# Patient Record
Sex: Female | Born: 1950 | Race: Black or African American | Hispanic: Yes | Smoking: Never smoker
Health system: Southern US, Community
[De-identification: ages and names within clinical notes are randomized; demographics above are authoritative.]

## PROBLEM LIST (undated history)

## (undated) DIAGNOSIS — I10 Essential (primary) hypertension: Secondary | ICD-10-CM

## (undated) HISTORY — PX: BREAST EXCISIONAL BIOPSY: SUR124

---

## 2012-04-07 ENCOUNTER — Encounter (HOSPITAL_COMMUNITY): Payer: Self-pay | Admitting: Emergency Medicine

## 2012-04-07 ENCOUNTER — Inpatient Hospital Stay (HOSPITAL_COMMUNITY)
Admission: EM | Admit: 2012-04-07 | Discharge: 2012-04-08 | DRG: 832 | Disposition: A | Discharge status: Home / Self Care | Payer: BC Managed Care – PPO | Attending: Internal Medicine | Admitting: Internal Medicine

## 2012-04-07 ENCOUNTER — Inpatient Hospital Stay (HOSPITAL_COMMUNITY): Payer: BC Managed Care – PPO

## 2012-04-07 ENCOUNTER — Emergency Department (HOSPITAL_COMMUNITY): Payer: BC Managed Care – PPO

## 2012-04-07 DIAGNOSIS — I10 Essential (primary) hypertension: Secondary | ICD-10-CM | POA: Diagnosis present

## 2012-04-07 DIAGNOSIS — R1310 Dysphagia, unspecified: Secondary | ICD-10-CM | POA: Diagnosis present

## 2012-04-07 DIAGNOSIS — R4789 Other speech disturbances: Secondary | ICD-10-CM | POA: Diagnosis present

## 2012-04-07 DIAGNOSIS — R4702 Dysphasia: Secondary | ICD-10-CM | POA: Diagnosis present

## 2012-04-07 DIAGNOSIS — G459 Transient cerebral ischemic attack, unspecified: Principal | ICD-10-CM | POA: Diagnosis present

## 2012-04-07 DIAGNOSIS — R4701 Aphasia: Secondary | ICD-10-CM | POA: Diagnosis present

## 2012-04-07 HISTORY — DX: Essential (primary) hypertension: I10

## 2012-04-07 LAB — COMPREHENSIVE METABOLIC PANEL
ALT: 19 U/L (ref 0–35)
AST: 18 U/L (ref 0–37)
Albumin: 3.6 g/dL (ref 3.5–5.2)
Alkaline Phosphatase: 81 U/L (ref 39–117)
CO2: 27 mEq/L (ref 19–32)
Chloride: 101 mEq/L (ref 96–112)
GFR calc non Af Amer: >90 mL/min (ref >90)
Potassium: 3.5 mEq/L (ref 3.5–5.1)
Total Bilirubin: 0.3 mg/dL (ref 0.3–1.2)

## 2012-04-07 LAB — POCT I-STAT, CHEM 8
BUN: 6 mg/dL (ref 6–23)
Chloride: 104 mEq/L (ref 96–112)
Creatinine, Ser: 0.9 mg/dL (ref 0.50–1.10)
Glucose, Bld: 99 mg/dL (ref 70–99)
Potassium: 3.7 mEq/L (ref 3.5–5.1)
Sodium: 142 mEq/L (ref 135–145)

## 2012-04-07 LAB — CBC
HCT: 42.4 % (ref 36.0–46.0)
Hemoglobin: 14.3 g/dL (ref 12.0–15.0)
MCHC: 33.7 g/dL (ref 30.0–36.0)
RDW: 12.9 % (ref 11.5–15.5)
WBC: 7.2 10*3/uL (ref 4.0–10.5)

## 2012-04-07 LAB — DIFFERENTIAL
Basophils Absolute: 0 10*3/uL (ref 0.0–0.1)
Basophils Relative: 0 % (ref 0–1)
Lymphocytes Relative: 53 % — ABNORMAL HIGH (ref 12–46)
Neutro Abs: 2.4 10*3/uL (ref 1.7–7.7)
Neutrophils Relative %: 34 % — ABNORMAL LOW (ref 43–77)

## 2012-04-07 LAB — APTT: aPTT: 25 seconds (ref 24–37)

## 2012-04-07 LAB — PROTIME-INR: INR: 0.97 (ref 0.00–1.49)

## 2012-04-07 LAB — GLUCOSE, CAPILLARY

## 2012-04-07 MED ORDER — ASPIRIN 81 MG PO CHEW
324.0000 mg | CHEWABLE_TABLET | Freq: Once | ORAL | Status: AC
  Administered 2012-04-07: 324 mg via ORAL
  Filled 2012-04-07: qty 4

## 2012-04-07 MED ORDER — SODIUM CHLORIDE 0.9 % IJ SOLN
3.0000 mL | Freq: Two times a day (BID) | INTRAMUSCULAR | Status: DC
  Administered 2012-04-07: 3 mL via INTRAVENOUS

## 2012-04-07 MED ORDER — SODIUM CHLORIDE 0.9 % IV SOLN
INTRAVENOUS | Status: DC
  Administered 2012-04-07: 14:00:00 via INTRAVENOUS

## 2012-04-07 MED ORDER — ENOXAPARIN SODIUM 40 MG/0.4ML ~~LOC~~ SOLN
40.0000 mg | Freq: Every day | SUBCUTANEOUS | Status: DC
  Filled 2012-04-07: qty 0.4

## 2012-04-07 MED ORDER — ONDANSETRON HCL 4 MG/2ML IJ SOLN: 4.0000 mg | Freq: Four times a day (QID) | INTRAMUSCULAR | Status: DC | PRN

## 2012-04-07 MED ORDER — VITAMIN C 500 MG PO TABS
500.0000 mg | ORAL_TABLET | Freq: Every day | ORAL | Status: DC
  Administered 2012-04-08: 500 mg via ORAL
  Filled 2012-04-07 (×2): qty 1

## 2012-04-07 MED ORDER — ASPIRIN 300 MG RE SUPP
300.0000 mg | Freq: Every day | RECTAL | Status: DC
  Filled 2012-04-07: qty 1

## 2012-04-07 MED ORDER — TIMOLOL MALEATE 0.5 % OP SOLN
1.0000 [drp] | Freq: Every day | OPHTHALMIC | Status: DC
  Administered 2012-04-08: 1 [drp] via OPHTHALMIC
  Filled 2012-04-07: qty 5

## 2012-04-07 MED ORDER — VITAMIN E 180 MG (400 UNIT) PO CAPS
400.0000 [IU] | ORAL_CAPSULE | Freq: Every day | ORAL | Status: DC
  Administered 2012-04-08: 400 [IU] via ORAL
  Filled 2012-04-07 (×2): qty 1

## 2012-04-07 MED ORDER — ACETAMINOPHEN 325 MG PO TABS: 650.0000 mg | ORAL_TABLET | ORAL | Status: DC | PRN

## 2012-04-07 MED ORDER — ACETAMINOPHEN 650 MG RE SUPP: 650.0000 mg | RECTAL | Status: DC | PRN

## 2012-04-07 MED ORDER — ASPIRIN 325 MG PO TABS
325.0000 mg | ORAL_TABLET | Freq: Every day | ORAL | Status: DC
  Administered 2012-04-08: 325 mg via ORAL
  Filled 2012-04-07: qty 1

## 2012-04-07 MED ORDER — SENNOSIDES-DOCUSATE SODIUM 8.6-50 MG PO TABS
1.0000 | ORAL_TABLET | Freq: Every evening | ORAL | Status: DC | PRN
  Filled 2012-04-07: qty 1

## 2012-04-07 MED ORDER — SODIUM CHLORIDE 0.9 % IV SOLN: 250.0000 mL | INTRAVENOUS | Status: DC | PRN

## 2012-04-07 MED ORDER — DOXYCYCLINE HYCLATE 100 MG PO TABS: 100.0000 mg | ORAL_TABLET | Freq: Every day | ORAL | Status: AC

## 2012-04-07 NOTE — ED Notes (Signed)
Report given to Landry Corporal on floor

## 2012-04-07 NOTE — H&P (Signed)
Hospital Admission Note Date: 04/07/2012  Patient name: Catherine Bennett Medical record number: [id number] Date of birth: 1951-03-25 Age: 61 y.o. Gender: female PCP: No primary provider on file.  Attending physician: Altha Harm, MD  Chief Complaint:Slurring of speech times one day.   History of Present Illness:This is a 61 year old female with a history of hypertension who presented the emergency room with complaints of dysphagia. According to the patient's daughter she spoke with her at approximately 9 AM and noticed some slurring of speech. She also stated that by the time she saw the patient at 11 AM the patient's these were significantly slurred. The patient offers that the symptom appears to be intermittent in its nature. The patient denies any weakness but does state that this morning she was feeling "sluggish". She however is clear to articulate that she did not have any focal weakness, headache, blurred vision, difficulty swallowing, drooling, chest pain.  On my observations the patient her speech is a very deliberate likened to an intentional over articulation mimicking slurring. This pattern occurs intermittently throughout the time of my evaluation.   Scheduled Meds:   . aspirin  324 mg Oral Once  . sodium chloride  3 mL Intravenous Q12H   Continuous Infusions:   . sodium chloride 100 mL/hr at 04/07/12 1421  . sodium chloride     PRN Meds:.sodium chloride Allergies: Penicillins Past Medical History  Diagnosis Date  . Hypertension    History reviewed. No pertinent past surgical history. Family History  Problem Relation Age of Onset  . Hypertension Mother    History   Social History  . Marital Status: Legally Separated    Spouse Name: N/A    Number of Children: N/A  . Years of Education: N/A   Occupational History  . Not on file.   Social History Main Topics  . Smoking status: Never Smoker   . Smokeless tobacco: Never Used  . Alcohol Use: No  . Drug  Use: No  . Sexually Active:    Other Topics Concern  . Not on file   Social History Narrative  . No narrative on file   Review of Systems: A comprehensive review of systems was negative. Physical Exam:  Intake/Output Summary (Last 24 hours) at 04/07/12 1739 Last data filed at 04/07/12 1503  Gross per 24 hour  Intake      0 ml  Output    525 ml  Net   -525 ml   General: Alert, awake, oriented x3, in no acute distress.  HEENT: [place]/AT PEERL, EOMI Neck: Trachea midline,  no masses, no thyromegal,y no JVD, no carotid bruit OROPHARYNX:  Moist, No exudate/ erythema/lesions.  Heart: Regular rate and rhythm, without murmurs, rubs, gallops.  Lungs: Clear to auscultation, no wheezing or rhonchi noted.  Abdomen: Soft, nontender, nondistended, positive bowel sounds, no masses no hepatosplenomegaly noted.  Neuro: No focal neurological deficits noted cranial nerves II through XII grossly intact. DTRs 2+ bilaterally upper and lower extremities. Strength 5/5 in bilateral upper and lower extremities. Pass pointing and heel-shin test normal. Tandem walking normal. Musculoskeletal: No warm swelling or erythema around joints, no spinal tenderness noted.   Lab results:  [facility] 04/07/12 1427 04/07/12 1415  NA 142 138  K 3.7 3.5  CL 104 101  CO2 -- 27  GLUCOSE 99 94  BUN 6 7  CREATININE 0.90 0.62  CALCIUM -- 10.4  MG -- --  PHOS -- --    Basename 04/07/12 1415  AST 18  ALT  19  ALKPHOS 81  BILITOT 0.3  PROT 8.4*  ALBUMIN 3.6   No results found for this basename: LIPASE:2,AMYLASE:2 in the last 72 hours  Basename 04/07/12 1427 04/07/12 1415  WBC -- 7.2  NEUTROABS -- 2.4  HGB 15.3* 14.3  HCT 45.0 42.4  MCV -- 89.6  PLT -- 266    Basename 04/07/12 1415  CKTOTAL --  CKMB --  CKMBINDEX --  TROPONINI <0.30   No components found with this basename: POCBNP:3 No results found for this basename: DDIMER:2 in the last 72 hours No results found for this basename: HGBA1C:2 in the  last 72 hours No results found for this basename: CHOL:2,HDL:2,LDLCALC:2,TRIG:2,CHOLHDL:2,LDLDIRECT:2 in the last 72 hours No results found for this basename: TSH,T4TOTAL,FREET3,T3FREE,THYROIDAB in the last 72 hours No results found for this basename: VITAMINB12:2,FOLATE:2,FERRITIN:2,TIBC:2,IRON:2,RETICCTPCT:2 in the last 72 hours Imaging results:  Ct Head Wo Contrast  04/07/2012  *RADIOLOGY REPORT*  Clinical Data: Aphasia.  Code stroke.  CT HEAD WITHOUT CONTRAST  Technique:  Contiguous axial images were obtained from the base of the skull through the vertex without contrast.  Comparison: Head CT 05/30/2004.  Findings: Mild cerebral and cerebellar atrophy.  No acute intracranial abnormality.  Specifically, no definite evidence of large vascular territory acute/subacute cerebral ischemia, no evidence of acute intracerebral hemorrhage, no focal mass, mass effect, hydrocephalus or abnormal intra or extra-axial fluid collections.  Visualized paranasal sinuses and mastoids are well pneumatized.  No acute displaced skull fractures are identified.  IMPRESSION: 1.  No acute intracranial abnormalities. 2.  Mild cerebral and cerebellar atrophy.  These results were called by telephone on 04/07/2012 at 02:30 p.m. to Dr. Ethelda Chick, who verbally acknowledged these results.   Original Report Authenticated By: Trudie Reed, M.D.    Other results: EKG:  normal sinus rhythm.   Patient Active Hospital Problem List: No active hospital problems.  Dysphasia:  Patient presents with dysphagia. It is unclear as to whether or not this is idiosyncratic oral may represent a true feature of an evolving stroke. The CT scan so far is negative. However the patient will be admitted and observed while receiving MRI for further evaluation and risk factor evaluation for better risk stratification.  Hypertension: The patient does have a history of hypertension, however in keeping with stroke recommendations will hold  antihypertensive medications for the next 24 hours and re-evaluate.   MATTHEWS,MICHELLE A. 04/07/2012, 5:39 PM

## 2012-04-07 NOTE — ED Notes (Signed)
hospitalist at bedside

## 2012-04-07 NOTE — ED Notes (Signed)
Pt's daughter states that she is normally a very articulate woman and this morning while she was on the phone, pt started having difficulty speaking.  Pt is stuttering and drawing out her words and speaking slowly.  Pt states that nothing is wrong and that she doesn't thing she has difficulty speaking.  All other neuro tests were WNL.  Last known well at 0900 this morning.  Told her daughter at that time that she wasn't feeling very well.

## 2012-04-07 NOTE — ED Notes (Signed)
rn talked with md, neurologist coming to [facility] ED, pt will not be transported.  Pt alert and oriented x4. Respirations even and unlabored, bilateral symmetrical rise and fall of chest. Skin warm and dry. In no acute distress. Denies needs.

## 2012-04-07 NOTE — ED Provider Notes (Signed)
History     CSN: [id number]  Arrival date & time 04/07/12  1332   First MD Initiated Contact with Patient 04/07/12 1402      Chief Complaint  Patient presents with  . Aphasia    (Consider location/radiation/quality/duration/timing/severity/associated sxs/prior treatment) HPI Patient developed difficulty speaking onset today. Last normal 9 9 AM today. Her daughter spoke with her at 9:53 AM today when she noticed that she had difficulty speaking. Patient denies any other complaint. No treatment prior to coming here her daughter reports that her speech progressively worsened until approximately 11 AM today. She remains with difficulty speaking. Patient denies focal weakness denies other complaint. No treatment prior to coming here Past Medical History  Diagnosis Date  . Hypertension     No past surgical history on file.  No family history on file.  History  Substance Use Topics  . Smoking status: Never Smoker   . Smokeless tobacco: Never Used  . Alcohol Use: No    OB History    Grav Para Term Preterm Abortions TAB SAB Ect Mult Living                  Review of Systems  Constitutional: Negative.   HENT: Negative.   Respiratory: Negative.   Cardiovascular: Negative.   Gastrointestinal: Negative.   Musculoskeletal: Negative.   Skin: Negative.   Neurological: Positive for speech difficulty.  Hematological: Negative.   Psychiatric/Behavioral: Negative.   All other systems reviewed and are negative.    Allergies  Penicillins  Home Medications  No current outpatient prescriptions on file.  BP 150/90  Pulse 83  Temp 98.6 F (37 C) (Oral)  Resp 16  Wt 210 lb (95.255 kg)  SpO2 100%  Physical Exam  Nursing note and vitals reviewed. Constitutional: She is oriented to person, place, and time. She appears well-developed and well-nourished.  HENT:  Head: Normocephalic and atraumatic.  Eyes: Conjunctivae normal are normal. Pupils are equal, round, and reactive to  light.  Neck: Neck supple. No tracheal deviation present. No thyromegaly present.  Cardiovascular: Normal rate and regular rhythm.   No murmur heard. Pulmonary/Chest: Effort normal and breath sounds normal.  Abdominal: Soft. Bowel sounds are normal. She exhibits no distension. There is no tenderness.  Musculoskeletal: Normal range of motion. She exhibits no edema and no tenderness.  Neurological: She is alert and oriented to person, place, and time. She has normal reflexes. No cranial nerve deficit. Coordination normal.       Expressive dysphasia motor strength 5 over 5 overall  Skin: Skin is warm and dry. No rash noted.  Psychiatric: She has a normal mood and affect.    ED Course  Procedures (including critical care time) Code stroke called by me.  Date: 04/07/2012  Rate: 80  Rhythm: normal sinus rhythm  QRS Axis: normal  Intervals: normal  ST/T Wave abnormalities: normal  Conduction Disutrbances: none  Narrative Interpretation: unremarkable  No old tracing for comparison Patient administered aspirin, mild after she passed swallowing screen Code stroke canceled the conversation with neurologist  Labs Reviewed - No data to display No results found. 4:20 PM patient speech is normal and she is asymptomatic Patient evaluated by neurologist Suggests admission for MRI MRA  No diagnosis found. Results for orders placed during the hospital encounter of 04/07/12  PROTIME-INR      Component Value Range   Prothrombin Time 12.8  11.6 - 15.2 seconds   INR 0.97  0.00 - 1.49  APTT  Component Value Range   aPTT 25  24 - 37 seconds  CBC      Component Value Range   WBC 7.2  4.0 - 10.5 K/uL   RBC 4.73  3.87 - 5.11 MIL/uL   Hemoglobin 14.3  12.0 - 15.0 g/dL   HCT 16.1  09.6 - 04.5 %   MCV 89.6  78.0 - 100.0 fL   MCH 30.2  26.0 - 34.0 pg   MCHC 33.7  30.0 - 36.0 g/dL   RDW 40.9  81.1 - 91.4 %   Platelets 266  150 - 400 K/uL  DIFFERENTIAL      Component Value Range    Neutrophils Relative 34 (*) 43 - 77 %   Neutro Abs 2.4  1.7 - 7.7 K/uL   Lymphocytes Relative 53 (*) 12 - 46 %   Lymphs Abs 3.8  0.7 - 4.0 K/uL   Monocytes Relative 11  3 - 12 %   Monocytes Absolute 0.8  0.1 - 1.0 K/uL   Eosinophils Relative 2  0 - 5 %   Eosinophils Absolute 0.1  0.0 - 0.7 K/uL   Basophils Relative 0  0 - 1 %   Basophils Absolute 0.0  0.0 - 0.1 K/uL  COMPREHENSIVE METABOLIC PANEL      Component Value Range   Sodium 138  135 - 145 mEq/L   Potassium 3.5  3.5 - 5.1 mEq/L   Chloride 101  96 - 112 mEq/L   CO2 27  19 - 32 mEq/L   Glucose, Bld 94  70 - 99 mg/dL   BUN 7  6 - 23 mg/dL   Creatinine, Ser 7.82  0.50 - 1.10 mg/dL   Calcium 95.6  8.4 - 21.3 mg/dL   Total Protein 8.4 (*) 6.0 - 8.3 g/dL   Albumin 3.6  3.5 - 5.2 g/dL   AST 18  0 - 37 U/L   ALT 19  0 - 35 U/L   Alkaline Phosphatase 81  39 - 117 U/L   Total Bilirubin 0.3  0.3 - 1.2 mg/dL   GFR calc non Af Amer >90  >90 mL/min   GFR calc Af Amer >90  >90 mL/min  TROPONIN I      Component Value Range   Troponin I <0.30  <0.30 ng/mL  GLUCOSE, CAPILLARY      Component Value Range   Glucose-Capillary 96  70 - 99 mg/dL  POCT I-STAT, CHEM 8      Component Value Range   Sodium 142  135 - 145 mEq/L   Potassium 3.7  3.5 - 5.1 mEq/L   Chloride 104  96 - 112 mEq/L   BUN 6  6 - 23 mg/dL   Creatinine, Ser 0.86  0.50 - 1.10 mg/dL   Glucose, Bld 99  70 - 99 mg/dL   Calcium, Ion 5.78 (*) 1.13 - 1.30 mmol/L   TCO2 27  0 - 100 mmol/L   Hemoglobin 15.3 (*) 12.0 - 15.0 g/dL   HCT 46.9  62.9 - 52.8 %   Ct Head Wo Contrast  04/07/2012  *RADIOLOGY REPORT*  Clinical Data: Aphasia.  Code stroke.  CT HEAD WITHOUT CONTRAST  Technique:  Contiguous axial images were obtained from the base of the skull through the vertex without contrast.  Comparison: Head CT 05/30/2004.  Findings: Mild cerebral and cerebellar atrophy.  No acute intracranial abnormality.  Specifically, no definite evidence of large vascular territory acute/subacute  cerebral ischemia, no evidence of acute intracerebral  hemorrhage, no focal mass, mass effect, hydrocephalus or abnormal intra or extra-axial fluid collections.  Visualized paranasal sinuses and mastoids are well pneumatized.  No acute displaced skull fractures are identified.  IMPRESSION: 1.  No acute intracranial abnormalities. 2.  Mild cerebral and cerebellar atrophy.  These results were called by telephone on 04/07/2012 at 02:30 p.m. to Dr. Ethelda Chick, who verbally acknowledged these results.   Original Report Authenticated By: Trudie Reed, M.D.       MDM  Spoke with Dr. Ashley Royalty, hospitalist plan admission, telemetry, inpatient evaluation for stroke Dx expressive aphasia        Doug Sou, MD 04/07/12 1624

## 2012-04-07 NOTE — ED Notes (Signed)
Pt wants to refuse chest xray to "keep hospital cost down". Note sent to hospitalist. Pt will be transported upstairs.

## 2012-04-07 NOTE — Consult Note (Signed)
Reason for Consult: Slurred speech Referring Physician: Doug Sou  CC: Slurred speech  History is obtained from: Patient, daughter  HPI: Catherine Bennett is a 61 y.o. female who was in her normal state of health up until 9 AM. Sometime after that, the patient noticed that she was slurring her speech, and felt like she had heaviness throughout her right side including her face. She initially said that she might have had numbness too, but later says that she did not mean this at all.  Throughout the interview she has intermittent slowed speech. She states that this has been coming and going all day.   ROS: A 14 point ROS was performed and is negative except as noted in the HPI.  Past Medical History  Diagnosis Date  . Hypertension     Family History: Mother- heart disease  Social History: Tob: Denies  Exam: Current vital signs: BP 157/95  Pulse 72  Temp 98.6 F (37 C) (Oral)  Resp 18  Ht 5\' 4"  (1.626 m)  Wt 95.255 kg (210 lb)  BMI 36.05 kg/m2  SpO2 100% Vital signs in last 24 hours: Temp:  [98.6 F (37 C)-99 F (37.2 C)] 98.6 F (37 C) (11/02 1836) Pulse Rate:  [72-83] 72  (11/02 1836) Resp:  [12-22] 18  (11/02 1836) BP: (145-162)/(80-97) 157/95 mmHg (11/02 1836) SpO2:  [98 %-100 %] 100 % (11/02 1836) Weight:  [95.255 kg (210 lb)] 95.255 kg (210 lb) (11/02 1343)  General: In bed, does not seem particularly concerned about her situation CV: Regular rate and rhythm Mental Status: Patient is awake, alert, oriented to person, place, month, year, and situation. Immediate and remote memory are intact. Patient is able to give a clear and coherent history. Able to spell world backwards without difficulty Ablet o give # of quarters in $2.75  Patient does not have any clear dysarthria, she does have a delivers speech pattern that appears psychogenic in nature. She is slow to respond, until she saw her daughter doing something with her phone she did not want to and then  quickly spoke without deficit. When she turned back to me, her speech was again slowed.  Cranial Nerves: II: Visual Fields are full. Pupils are equal, round, and reactive to light.  Discs are difficult to visualize. III,IV, VI: EOMI without ptosis or diploplia.  V: Facial sensation is symmetric to temperature VII: Facial movement is symmetric.  VIII: hearing is intact to voice X: Uvula elevates symmetrically XI: Shoulder shrug is symmetric. XII: tongue is midline without atrophy or fasciculations.  Motor: Tone is normal. Bulk is normal. 5/5 strength was present in all four extremities.  Sensory: Sensation is symmetric to light touch and temperature in the arms and legs. Deep Tendon Reflexes: 2+ and symmetric in the biceps and patellae.  Plantars: Toes are downgoing bilaterally.  Cerebellar: FNF and HKS are intact bilaterally Gait: Patient has a stable casual gait. 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 I am concerned that she might have had a TIA earlier today given her statement about her right-sided "heaviness" that subsequently resolved. It is possible that she had a mild aphasia of onset, and since has been embellishing as a conversion reaction. Given the right-sided weakness earlier, I would recommend treating this as a TIA and admitting her for a TIA workup  Recommendations: 1. HgbA1c, fasting lipid panel 2. MRI, MRA  of the brain without contrast 3. Echocardiogram 4. Carotid dopplers 5. Prophylactic therapy-Antiplatelet med: Aspirin - dose 325mg .  6. Risk factor modification 7. Telemetry monitoring   Ritta Slot, MD [facility] [phone]  If 7pm- 7am, please page neurology on call at [phone].

## 2012-04-07 NOTE — ED Notes (Signed)
Patient transported to CT 

## 2012-04-07 NOTE — ED Notes (Signed)
First attempt to call report. Floor rn will call back in a few minutes 

## 2012-04-08 ENCOUNTER — Inpatient Hospital Stay (HOSPITAL_COMMUNITY): Payer: BC Managed Care – PPO

## 2012-04-08 DIAGNOSIS — G459 Transient cerebral ischemic attack, unspecified: Secondary | ICD-10-CM

## 2012-04-08 DIAGNOSIS — R4789 Other speech disturbances: Secondary | ICD-10-CM

## 2012-04-08 LAB — LIPID PANEL
Cholesterol: 173 mg/dL (ref 0–200)
LDL Cholesterol: 74 mg/dL (ref 0–99)
Total CHOL/HDL Ratio: 2.1 RATIO
VLDL: 17 mg/dL (ref 0–40)

## 2012-04-08 LAB — GLUCOSE, CAPILLARY: Glucose-Capillary: 108 mg/dL — ABNORMAL HIGH (ref 70–99)

## 2012-04-08 MED ORDER — LORAZEPAM 2 MG/ML IJ SOLN
1.0000 mg | Freq: Once | INTRAMUSCULAR | Status: AC
  Administered 2012-04-08: 1 mg via INTRAVENOUS
  Filled 2012-04-08: qty 1

## 2012-04-08 MED ORDER — ASPIRIN EC 81 MG PO TBEC: 81.0000 mg | DELAYED_RELEASE_TABLET | Freq: Every day | ORAL | Status: AC

## 2012-04-08 NOTE — Progress Notes (Signed)
VASCULAR LAB PRELIMINARY  PRELIMINARY  PRELIMINARY  PRELIMINARY  Carotid Dopplers completed.    Preliminary report:  No significant ICA stenosis.  Vertebral artery flow is antegrade.  Sahir Tolson, 04/08/2012, 10:13 AM

## 2012-04-08 NOTE — Progress Notes (Signed)
  Echocardiogram 2D Echocardiogram has been performed.  Catherine Bennett 04/08/2012, 10:04 AM

## 2012-04-08 NOTE — Discharge Summary (Signed)
Physician Discharge Summary  Llana Deshazo NWG:[id number] DOB: 01-21-1951 DOA: 04/07/2012  PCP: No primary provider on file.  Admit date: 04/07/2012 Discharge date: 04/08/2012  Recommendations for Outpatient Follow-up:  1. Pt will need to follow up with PCP in 2-3 weeks post discharge 2. Please obtain BMP to evaluate electrolytes and kidney function 3. Please also check CBC to evaluate Hg and Hct levels 4. Pt was advised to call back in AM to go over the results of 2 D ECHO which are pending at the time of the discharge  Discharge Diagnoses: Slurred speech, ? TIA, resolved Active Problems:  HTN (hypertension)  Dysphasia  TIA (transient ischemic attack)  Discharge Condition: Stable  Diet recommendation: Heart healthy diet discussed in details   History of present illness:  Pt is 61 year old female with a history of hypertension who presented the emergency room with complaints of aphasia. According to the patient's daughter she spoke with her at approximately 9 AM and noticed some slurring of speech. She also stated that by the time she saw the patient at 11 AM the patient's speech was significantly slurred. The patient offers that the symptom appears to be intermittent in its nature. The patient denies any weakness but does state that the morning of admission she was feeling "sluggish". She however is clear to articulate that she did not have any focal weakness, headache, blurred vision, difficulty swallowing, drooling, chest pain.   Hospital Course:  Active Problems:  HTN (hypertension), accelerated - pt reports compliance with medical therapy - encourage dietary compliance as well - regular BP checks with PCP   TIA (transient ischemic attack) - slurred speech resolved - MRI indicative of remote infarct but nothing acute - discussed with neurologist and he was agreeable with pt's discharge as per pt request - preliminary report on carotid doppler is negative - 2  D ECHO pending at  the time of the discharge - pt agreed to call in AM for results - will prescribe Aspirin upon discharge   Procedures/Studies: Ct Head Wo Contrast 04/07/2012     1.  No acute intracranial abnormalities.  2.  Mild cerebral and cerebellar atrophy.   Mr Maxine Glenn Head/brain Wo Cm 04/08/2012    Remote left mid pontine lacunar infarct.   Mild cerebral and cerebellar atrophy.  No acute intracranial findings.   MRA HEAD    No proximal intracranial stenosis or occlusion.     Consultations:  Neurology   Antibiotics:  None  Discharge Exam: Filed Vitals:   04/08/12 0533  BP: 131/89  Pulse: 80  Temp: 98.4 F (36.9 C)  Resp: 18   Filed Vitals:   04/07/12 1836 04/07/12 1900 04/07/12 2301 04/08/12 0533  BP: 157/95  134/75 131/89  Pulse: 72  88 80  Temp: 98.6 F (37 C)  98.2 F (36.8 C) 98.4 F (36.9 C)  TempSrc:   Oral Oral  Resp: 18  16 18   Height:  5\' 4"  (1.626 m)    Weight:      SpO2: 100%  100% 98%    General: Pt is alert, follows commands appropriately, not in acute distress Cardiovascular: Regular rate and rhythm, S1/S2 +, no murmurs, no rubs, no gallops Respiratory: Clear to auscultation bilaterally, no wheezing, no crackles, no rhonchi Abdominal: Soft, non tender, non distended, bowel sounds +, no guarding Extremities: no edema, no cyanosis, pulses palpable bilaterally DP and PT Neuro: Grossly nonfocal  Discharge Instructions  Discharge Orders    Future Orders Please Complete By Expires  Diet - low sodium heart healthy      Diet - low sodium heart healthy      Increase activity slowly      Increase activity slowly          Medication List     As of 04/08/2012 12:06 PM    TAKE these medications         aspirin EC 81 MG tablet   Take 1 tablet (81 mg total) by mouth daily.      timolol 0.5 % ophthalmic solution   Commonly known as: BETIMOL   Place 1 drop into both eyes every morning.      triamterene-hydrochlorothiazide 37.5-25 MG per tablet   Commonly  known as: MAXZIDE-25   Take 1 tablet by mouth every morning.      vitamin C 500 MG tablet   Commonly known as: ASCORBIC ACID   Take 500 mg by mouth daily.      vitamin E 400 UNIT capsule   Generic drug: vitamin E   Take 400 Units by mouth daily.           Follow-up Information    Follow up with Debbora Presto, MD. (primary care provider as needed)    Contact information:   [address] [place] [address] cell [phone]          The results of significant diagnostics from this hospitalization (including imaging, microbiology, ancillary and laboratory) are listed below for reference.     Microbiology: No results found for this or any previous visit (from the past 240 hour(s)).   Labs: Basic Metabolic Panel:  Lab 04/07/12 9147 04/07/12 1415  NA 142 138  K 3.7 3.5  CL 104 101  CO2 -- 27  GLUCOSE 99 94  BUN 6 7  CREATININE 0.90 0.62  CALCIUM -- 10.4  MG -- --  PHOS -- --   Liver Function Tests:  Lab 04/07/12 1415  AST 18  ALT 19  ALKPHOS 81  BILITOT 0.3  PROT 8.4*  ALBUMIN 3.6   CBC:  Lab 04/07/12 1427 04/07/12 1415  WBC -- 7.2  NEUTROABS -- 2.4  HGB 15.3* 14.3  HCT 45.0 42.4  MCV -- 89.6  PLT -- 266   Cardiac Enzymes:  Lab 04/07/12 1415  CKTOTAL --  CKMB --  CKMBINDEX --  TROPONINI <0.30   CBG:  Lab 04/08/12 0753 04/07/12 2048 04/07/12 1442  GLUCAP 108* 117* 96    SIGNED: Time coordinating discharge: Over 30 minutes  Debbora Presto, MD  [facility] 04/08/2012, 12:06 PM Pager [phone]  If 7PM-7AM, please contact night-coverage [email or web address] Password [email or web address]

## 2012-04-09 LAB — POCT I-STAT TROPONIN I: Troponin i, poc: 0 ng/mL (ref 0.00–0.08)

## 2013-03-04 ENCOUNTER — Other Ambulatory Visit: Payer: Self-pay | Admitting: Family Medicine

## 2013-03-04 DIAGNOSIS — R921 Mammographic calcification found on diagnostic imaging of breast: Secondary | ICD-10-CM

## 2013-03-14 ENCOUNTER — Ambulatory Visit
Admission: RE | Admit: 2013-03-14 | Discharge: 2013-03-14 | Disposition: A | Discharge status: Home / Self Care | Payer: BC Managed Care – PPO | Source: Ambulatory Visit | Attending: Family Medicine | Admitting: Family Medicine

## 2013-03-14 DIAGNOSIS — R921 Mammographic calcification found on diagnostic imaging of breast: Secondary | ICD-10-CM

## 2014-03-27 ENCOUNTER — Other Ambulatory Visit: Payer: Self-pay | Admitting: Family Medicine

## 2014-03-27 DIAGNOSIS — R921 Mammographic calcification found on diagnostic imaging of breast: Secondary | ICD-10-CM

## 2014-04-22 ENCOUNTER — Ambulatory Visit
Admission: RE | Admit: 2014-04-22 | Discharge: 2014-04-22 | Disposition: A | Discharge status: Home / Self Care | Payer: BC Managed Care – PPO | Source: Ambulatory Visit | Attending: Family Medicine | Admitting: Family Medicine

## 2014-04-22 DIAGNOSIS — R921 Mammographic calcification found on diagnostic imaging of breast: Secondary | ICD-10-CM

## 2016-10-05 ENCOUNTER — Other Ambulatory Visit: Payer: Self-pay | Admitting: Family Medicine

## 2016-10-05 DIAGNOSIS — R921 Mammographic calcification found on diagnostic imaging of breast: Secondary | ICD-10-CM

## 2016-10-27 ENCOUNTER — Ambulatory Visit
Admission: RE | Admit: 2016-10-27 | Discharge: 2016-10-27 | Disposition: A | Discharge status: Home / Self Care | Payer: BC Managed Care – PPO | Source: Ambulatory Visit | Attending: Family Medicine | Admitting: Family Medicine

## 2016-10-27 DIAGNOSIS — R921 Mammographic calcification found on diagnostic imaging of breast: Secondary | ICD-10-CM

## 2020-03-25 ENCOUNTER — Other Ambulatory Visit: Payer: Self-pay | Admitting: Family Medicine

## 2020-03-25 DIAGNOSIS — Z1231 Encounter for screening mammogram for malignant neoplasm of breast: Secondary | ICD-10-CM

## 2020-04-28 ENCOUNTER — Ambulatory Visit: Payer: BC Managed Care – PPO

## 2021-07-12 ENCOUNTER — Ambulatory Visit
Admission: RE | Admit: 2021-07-12 | Discharge: 2021-07-12 | Disposition: A | Discharge status: Home / Self Care | Payer: BC Managed Care – PPO | Source: Ambulatory Visit | Attending: Family Medicine | Admitting: Family Medicine

## 2021-07-12 ENCOUNTER — Ambulatory Visit
Admission: RE | Admit: 2021-07-12 | Discharge: 2021-07-12 | Disposition: A | Discharge status: Home / Self Care | Payer: BC Managed Care – PPO | Attending: Family Medicine | Admitting: Family Medicine

## 2021-07-12 ENCOUNTER — Other Ambulatory Visit: Payer: Self-pay | Admitting: Family Medicine

## 2021-07-12 DIAGNOSIS — M549 Dorsalgia, unspecified: Secondary | ICD-10-CM

## 2021-07-12 DIAGNOSIS — M542 Cervicalgia: Secondary | ICD-10-CM | POA: Insufficient documentation

## 2021-07-13 ENCOUNTER — Other Ambulatory Visit (HOSPITAL_COMMUNITY): Payer: Self-pay | Admitting: Family Medicine

## 2021-07-13 ENCOUNTER — Other Ambulatory Visit: Payer: Self-pay | Admitting: Family Medicine

## 2021-07-13 DIAGNOSIS — M542 Cervicalgia: Secondary | ICD-10-CM

## 2021-07-15 ENCOUNTER — Ambulatory Visit: Payer: BC Managed Care – PPO

## 2023-01-09 ENCOUNTER — Other Ambulatory Visit: Payer: Self-pay

## 2023-01-09 ENCOUNTER — Emergency Department (HOSPITAL_COMMUNITY): Payer: BC Managed Care – PPO

## 2023-01-09 ENCOUNTER — Encounter (HOSPITAL_COMMUNITY): Payer: Self-pay

## 2023-01-09 ENCOUNTER — Emergency Department (HOSPITAL_COMMUNITY)
Admission: EM | Admit: 2023-01-09 | Discharge: 2023-01-09 | Disposition: A | Discharge status: Home / Self Care | Payer: BC Managed Care – PPO | Attending: Emergency Medicine | Admitting: Emergency Medicine

## 2023-01-09 DIAGNOSIS — E876 Hypokalemia: Secondary | ICD-10-CM | POA: Insufficient documentation

## 2023-01-09 DIAGNOSIS — Z79899 Other long term (current) drug therapy: Secondary | ICD-10-CM | POA: Insufficient documentation

## 2023-01-09 DIAGNOSIS — I10 Essential (primary) hypertension: Secondary | ICD-10-CM | POA: Insufficient documentation

## 2023-01-09 DIAGNOSIS — L0291 Cutaneous abscess, unspecified: Secondary | ICD-10-CM

## 2023-01-09 DIAGNOSIS — H00031 Abscess of right upper eyelid: Secondary | ICD-10-CM | POA: Insufficient documentation

## 2023-01-09 DIAGNOSIS — Z8673 Personal history of transient ischemic attack (TIA), and cerebral infarction without residual deficits: Secondary | ICD-10-CM | POA: Diagnosis not present

## 2023-01-09 DIAGNOSIS — Z7982 Long term (current) use of aspirin: Secondary | ICD-10-CM | POA: Insufficient documentation

## 2023-01-09 LAB — CBC WITH DIFFERENTIAL/PLATELET
Abs Immature Granulocytes: 0.01 10*3/uL (ref 0.00–0.07)
Basophils Absolute: 0 10*3/uL (ref 0.0–0.1)
Basophils Relative: 1 %
Eosinophils Absolute: 0.4 10*3/uL (ref 0.0–0.5)
Eosinophils Relative: 4 %
HCT: 39 % (ref 36.0–46.0)
Hemoglobin: 12.6 g/dL (ref 12.0–15.0)
Immature Granulocytes: 0 %
Lymphocytes Relative: 40 %
Lymphs Abs: 3.5 10*3/uL (ref 0.7–4.0)
MCH: 30.1 pg (ref 26.0–34.0)
MCHC: 32.3 g/dL (ref 30.0–36.0)
MCV: 93.1 fL (ref 80.0–100.0)
Monocytes Absolute: 1.4 10*3/uL — ABNORMAL HIGH (ref 0.1–1.0)
Monocytes Relative: 16 %
Neutro Abs: 3.5 10*3/uL (ref 1.7–7.7)
Neutrophils Relative %: 39 %
Platelets: 217 10*3/uL (ref 150–400)
RBC: 4.19 MIL/uL (ref 3.87–5.11)
RDW: 12.7 % (ref 11.5–15.5)
WBC: 8.9 10*3/uL (ref 4.0–10.5)
nRBC: 0 % (ref 0.0–0.2)

## 2023-01-09 LAB — I-STAT CHEM 8, ED
BUN: 7 mg/dL — ABNORMAL LOW (ref 8–23)
Calcium, Ion: 1.22 mmol/L (ref 1.15–1.40)
Chloride: 100 mmol/L (ref 98–111)
Creatinine, Ser: 0.7 mg/dL (ref 0.44–1.00)
Glucose, Bld: 91 mg/dL (ref 70–99)
HCT: 40 % (ref 36.0–46.0)
Hemoglobin: 13.6 g/dL (ref 12.0–15.0)
Potassium: 3.2 mmol/L — ABNORMAL LOW (ref 3.5–5.1)
Sodium: 136 mmol/L (ref 135–145)
TCO2: 24 mmol/L (ref 22–32)

## 2023-01-09 MED ORDER — FLUCONAZOLE 200 MG PO TABS: 200.0000 mg | ORAL_TABLET | Freq: Once | ORAL | 0 refills | Status: AC

## 2023-01-09 MED ORDER — AMOXICILLIN-POT CLAVULANATE 875-125 MG PO TABS: 1.0000 | ORAL_TABLET | Freq: Two times a day (BID) | ORAL | 0 refills | Status: AC

## 2023-01-09 MED ORDER — IOHEXOL 350 MG/ML SOLN
75.0000 mL | Freq: Once | INTRAVENOUS | Status: AC | PRN
  Administered 2023-01-09: 75 mL via INTRAVENOUS

## 2023-01-09 NOTE — Discharge Instructions (Addendum)
Continue the Bactrim.  You may stop the doxycycline and take the Augmentin instead.  Continue Erythromycin eye ointment. Apply warm compresses to the draining area 2-3 times a day.  Monitor for any signs of worsening including increased pain, fevers, spreading redness, or difficulty moving your eye.  Follow-up with your primary care physician in the next 2 to 3 days.  Also follow-up with ophthalmologist soon as possible.  Return to the ED for any worsening symptoms or further concerns.

## 2023-01-09 NOTE — ED Triage Notes (Signed)
Pt has abscess near right [id number]. Pt states the abscess has gotten bigger. Pt states went to eye clinic and has been on atx, but abscess has gotten bigger. Pt denies it affects eye sight.

## 2023-01-09 NOTE — ED Notes (Signed)
Dressing to right eye changed and patient provided with tissues.

## 2023-01-09 NOTE — ED Provider Notes (Signed)
  [place] [facility] AT [facility] Provider Note   CSN: [id number] Arrival date & time: 01/09/23  1300     History {Add pertinent medical, surgical, social history, OB history to HPI:1} No chief complaint on file.   Evaly Ginger is a 72 y.o. female.  HPI     Home Medications Prior to Admission medications   Medication Sig Start Date End Date Taking? Authorizing Provider  aspirin EC 81 MG tablet Take 1 tablet (81 mg total) by mouth daily. 04/08/12   Dorothea Ogle, MD  timolol (BETIMOL) 0.5 % ophthalmic solution Place 1 drop into both eyes every morning.    [provider]  triamterene-hydrochlorothiazide (MAXZIDE-25) 37.5-25 MG per tablet Take 1 tablet by mouth every morning.    [provider]  vitamin C (ASCORBIC ACID) 500 MG tablet Take 500 mg by mouth daily.    [provider]  vitamin E (VITAMIN E) 400 UNIT capsule Take 400 Units by mouth daily.    [provider]      Allergies    Penicillins    Review of Systems   Review of Systems  Physical Exam Updated Vital Signs BP (!) 185/101 (BP Location: Left Arm)   Pulse 81   Temp 98.9 F (37.2 C) (Oral)   Resp 19   Ht 5\' 4"  (1.626 m)   Wt 95.3 kg   SpO2 99%   BMI 36.06 kg/m  Physical Exam  ED Results / Procedures / Treatments   Labs (all labs ordered are listed, but only abnormal results are displayed) Labs Reviewed - No data to display  EKG None  Radiology No results found.  Procedures Procedures  {Document cardiac monitor, telemetry assessment procedure when appropriate:1}  Medications Ordered in ED Medications - No data to display  ED Course/ Medical Decision Making/ A&P   {   Click here for ABCD2, HEART and other calculatorsREFRESH Note before signing :1}                              Medical Decision Making  ***  {Document critical care time when appropriate:1} {Document review of labs and clinical decision tools ie heart score,  Chads2Vasc2 etc:1}  {Document your independent review of radiology images, and any outside records:1} {Document your discussion with family members, caretakers, and with consultants:1} {Document social determinants of health affecting pt's care:1} {Document your decision making why or why not admission, treatments were needed:1} Final Clinical Impression(s) / ED Diagnoses Final diagnoses:  None    Rx / DC Orders ED Discharge Orders     None

## 2023-05-16 IMAGING — CR DG CERVICAL SPINE COMPLETE 4+V
1 series · 5 of 5 positions shown · non-contrast
Comparison: None.

CLINICAL DATA: Neck pain common no known injury, initial encounter

EXAM:
CERVICAL SPINE - COMPLETE 4+ VIEW

[Series 1: dg cervical spine complete · 0.14mm/px · 5 of 5 slices shown]
[im 1/5]
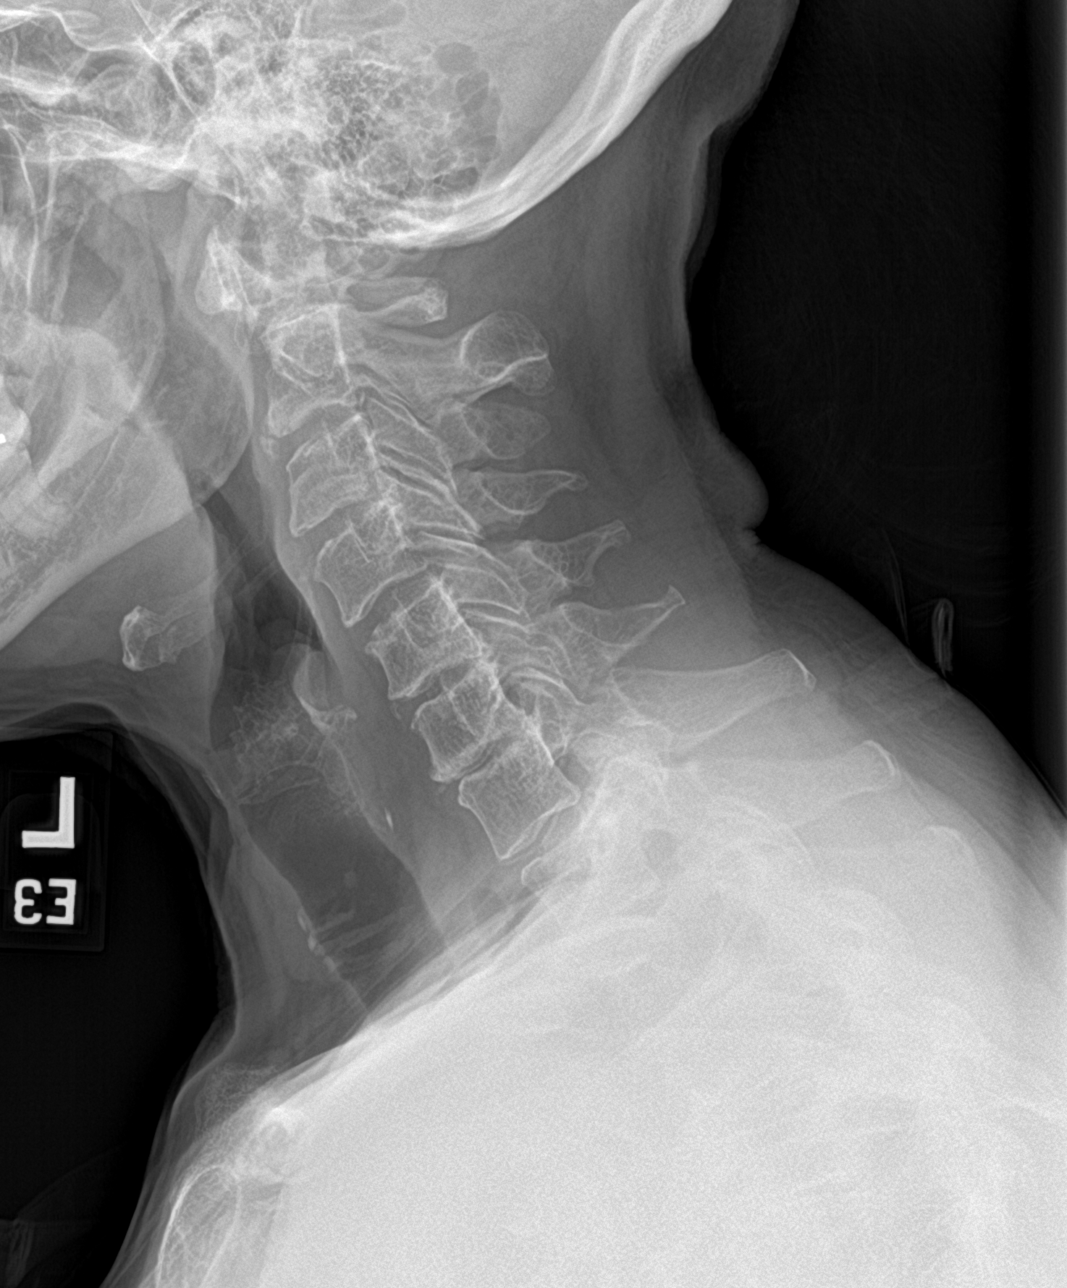
[im 2/5]
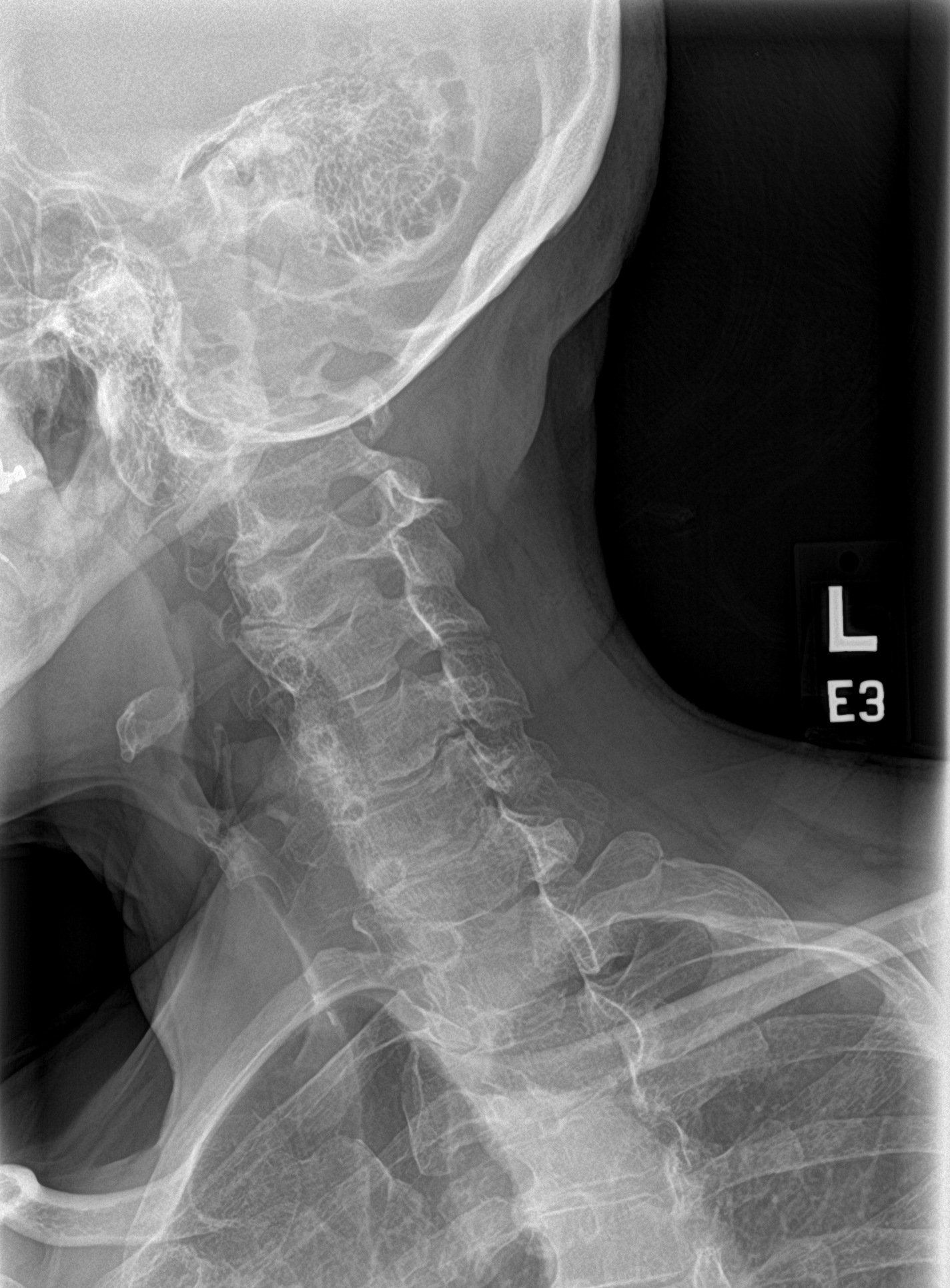
[im 3/5]
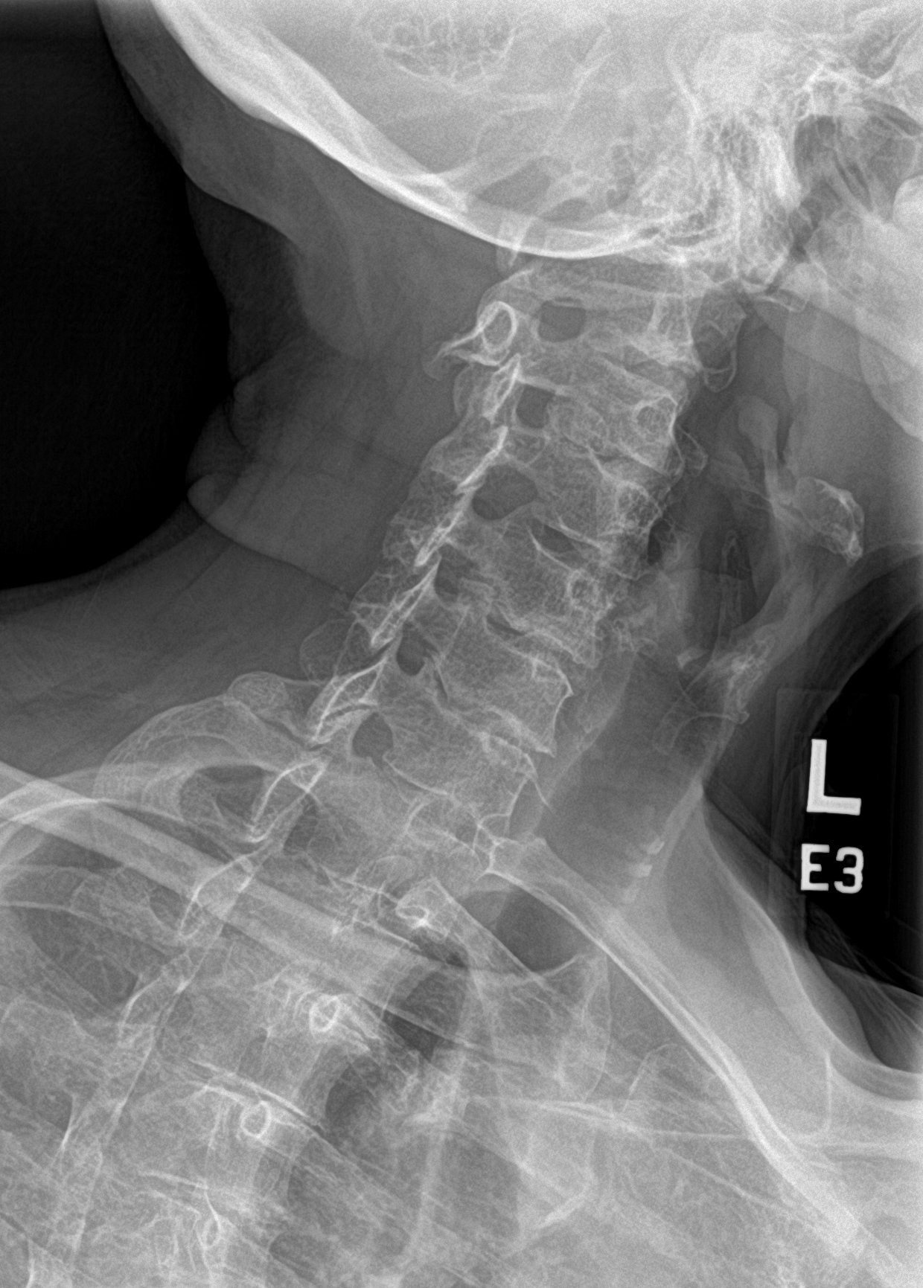
[im 4/5]
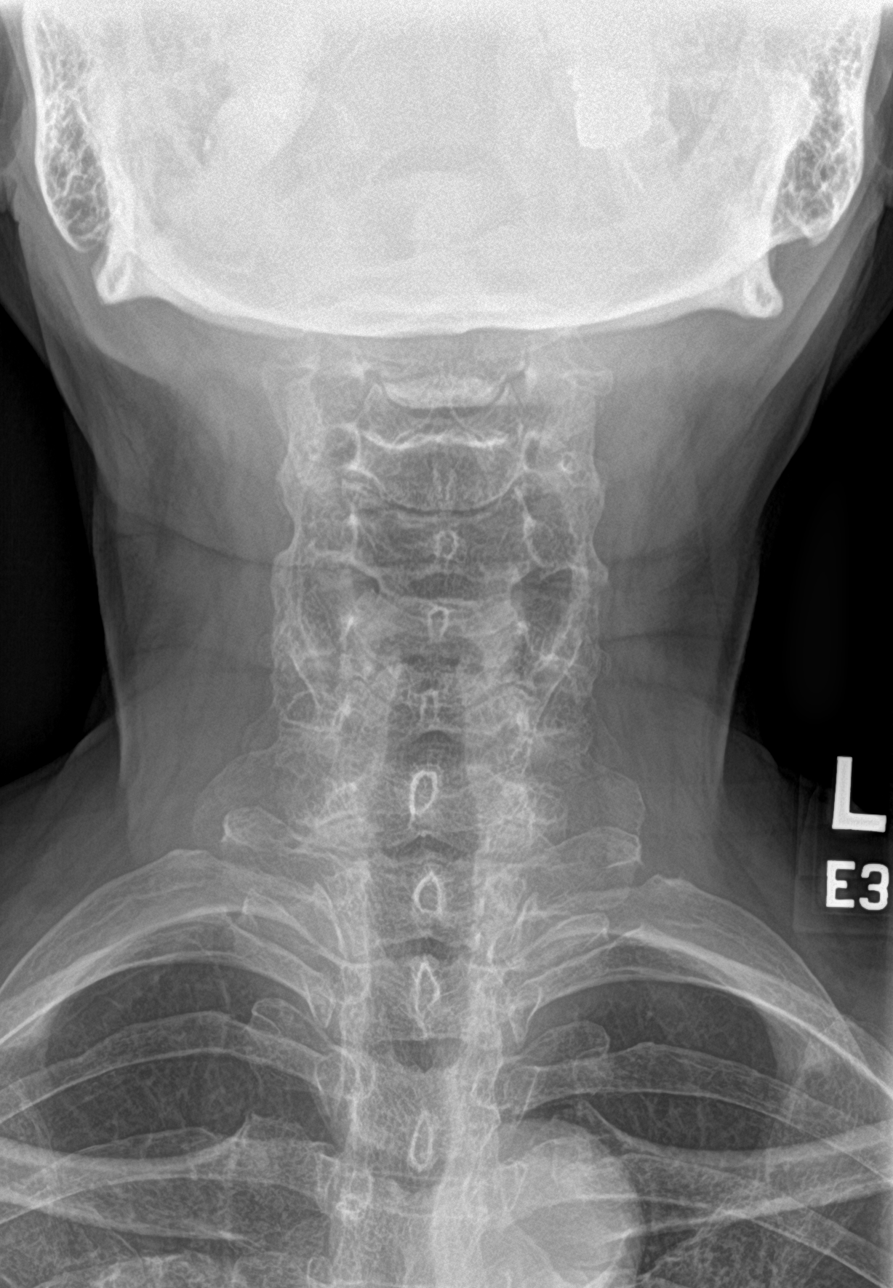
[im 5/5]
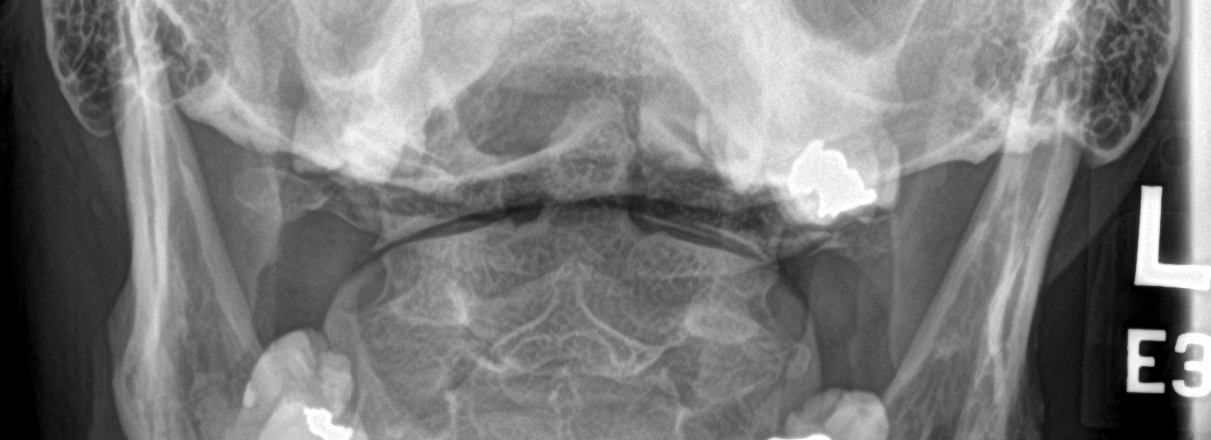

[5 of 5 positions shown; findings below may reference images not displayed]

FINDINGS: Seven cervical segments are well visualized. There is widening of
the space between the odontoid in the anterior aspect of the C1
vertebra to at least 6 mm. Some widening of the space between the
lateral masses of C1 and the odontoid is noted particularly on the
right although this may be rotational in nature. Neural foramina are
patent on the right with only mild narrowing at C5-6 and C6-7.
Significant narrowing on the left at C5-6 and C6-7 is noted. Mild
osteophytic changes are noted as well as facet hypertrophic changes.
No soft tissue abnormality is seen.
IMPRESSION: Degenerative changes of the cervical spine.

Widening of the space between the odontoid in the anterior aspect of
C1 as well as some asymmetric widening on the frontal film as
described. CT of the cervical spine is recommended for further
evaluation.

These results will be called to the ordering clinician or
representative by the Radiologist Assistant, and communication
documented in the PACS or [REDACTED].
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
